# Patient Record
Sex: Female | Born: 2000 | Race: White | Hispanic: No | Marital: Single | Smoking: Never smoker
Health system: Southern US, Community
[De-identification: ages and names within clinical notes are randomized; demographics above are authoritative.]

## PROBLEM LIST (undated history)

## (undated) HISTORY — PX: APPENDECTOMY: SHX54

## (undated) HISTORY — PX: WISDOM TOOTH EXTRACTION: SHX21

---

## 2019-07-14 ENCOUNTER — Emergency Department (HOSPITAL_COMMUNITY): Payer: Self-pay

## 2019-07-14 ENCOUNTER — Emergency Department (HOSPITAL_COMMUNITY)
Admission: EM | Admit: 2019-07-14 | Discharge: 2019-07-14 | Disposition: A | Discharge status: Home / Self Care | Payer: Self-pay | Attending: Emergency Medicine | Admitting: Emergency Medicine

## 2019-07-14 ENCOUNTER — Encounter (HOSPITAL_COMMUNITY): Payer: Self-pay | Admitting: Emergency Medicine

## 2019-07-14 ENCOUNTER — Other Ambulatory Visit: Payer: Self-pay

## 2019-07-14 DIAGNOSIS — R1012 Left upper quadrant pain: Secondary | ICD-10-CM | POA: Insufficient documentation

## 2019-07-14 DIAGNOSIS — R112 Nausea with vomiting, unspecified: Secondary | ICD-10-CM | POA: Insufficient documentation

## 2019-07-14 LAB — URINALYSIS, ROUTINE W REFLEX MICROSCOPIC
Bacteria, UA: NONE SEEN
Bilirubin Urine: NEGATIVE
Glucose, UA: NEGATIVE mg/dL
Ketones, ur: NEGATIVE mg/dL
Leukocytes,Ua: NEGATIVE
Nitrite: NEGATIVE
Protein, ur: NEGATIVE mg/dL
Specific Gravity, Urine: 1.02 (ref 1.005–1.030)
pH: 5 (ref 5.0–8.0)

## 2019-07-14 LAB — CBC WITH DIFFERENTIAL/PLATELET
Abs Immature Granulocytes: 0.02 10*3/uL (ref 0.00–0.07)
Basophils Absolute: 0 10*3/uL (ref 0.0–0.1)
Basophils Relative: 0 %
Eosinophils Absolute: 0.2 10*3/uL (ref 0.0–0.5)
Eosinophils Relative: 2 %
HCT: 43 % (ref 36.0–46.0)
Hemoglobin: 14.2 g/dL (ref 12.0–15.0)
Immature Granulocytes: 0 %
Lymphocytes Relative: 25 %
Lymphs Abs: 1.9 10*3/uL (ref 0.7–4.0)
MCH: 27.4 pg (ref 26.0–34.0)
MCHC: 33 g/dL (ref 30.0–36.0)
MCV: 83 fL (ref 80.0–100.0)
Monocytes Absolute: 0.5 10*3/uL (ref 0.1–1.0)
Monocytes Relative: 7 %
Neutro Abs: 4.8 10*3/uL (ref 1.7–7.7)
Neutrophils Relative %: 66 %
Platelets: 388 10*3/uL (ref 150–400)
RBC: 5.18 MIL/uL — ABNORMAL HIGH (ref 3.87–5.11)
RDW: 14.4 % (ref 11.5–15.5)
WBC: 7.4 10*3/uL (ref 4.0–10.5)
nRBC: 0 % (ref 0.0–0.2)

## 2019-07-14 LAB — COMPREHENSIVE METABOLIC PANEL
ALT: 21 U/L (ref 0–44)
AST: 21 U/L (ref 15–41)
Albumin: 4.5 g/dL (ref 3.5–5.0)
Alkaline Phosphatase: 57 U/L (ref 38–126)
Anion gap: 12 (ref 5–15)
BUN: 16 mg/dL (ref 6–20)
CO2: 22 mmol/L (ref 22–32)
Calcium: 9.3 mg/dL (ref 8.9–10.3)
Chloride: 102 mmol/L (ref 98–111)
Creatinine, Ser: 0.76 mg/dL (ref 0.44–1.00)
GFR calc Af Amer: >60 mL/min (ref >60)
GFR calc non Af Amer: >60 mL/min (ref >60)
Glucose, Bld: 99 mg/dL (ref 70–99)
Potassium: 4.3 mmol/L (ref 3.5–5.1)
Sodium: 136 mmol/L (ref 135–145)
Total Bilirubin: 0.7 mg/dL (ref 0.3–1.2)
Total Protein: 8.6 g/dL — ABNORMAL HIGH (ref 6.5–8.1)

## 2019-07-14 LAB — LIPASE, BLOOD: Lipase: 36 U/L (ref 11–51)

## 2019-07-14 LAB — PREGNANCY, URINE: Preg Test, Ur: NEGATIVE

## 2019-07-14 MED ORDER — PROMETHAZINE HCL 25 MG PO TABS: 25.0000 mg | ORAL_TABLET | Freq: Four times a day (QID) | ORAL | 0 refills | Status: AC | PRN

## 2019-07-14 MED ORDER — ONDANSETRON HCL 4 MG/2ML IJ SOLN
4.0000 mg | Freq: Once | INTRAMUSCULAR | Status: AC
  Administered 2019-07-14: 4 mg via INTRAVENOUS
  Filled 2019-07-14: qty 2

## 2019-07-14 MED ORDER — OMEPRAZOLE 20 MG PO CPDR: 20.0000 mg | DELAYED_RELEASE_CAPSULE | Freq: Every day | ORAL | 0 refills | Status: AC

## 2019-07-14 MED ORDER — ONDANSETRON 4 MG PO TBDP: 4.0000 mg | ORAL_TABLET | Freq: Three times a day (TID) | ORAL | 0 refills | Status: DC | PRN

## 2019-07-14 MED ORDER — SODIUM CHLORIDE 0.9 % IV BOLUS
1000.0000 mL | Freq: Once | INTRAVENOUS | Status: AC
  Administered 2019-07-14: 11:00:00 1000 mL via INTRAVENOUS

## 2019-07-14 NOTE — ED Provider Notes (Signed)
[facility] EMERGENCY DEPARTMENT Provider Note   CSN: [id number] Arrival date & time: 07/14/19  2836     History Chief Complaint  Patient presents with  . Abdominal Pain    Kimberly Mathews is a 19 y.o. female.  HPI Patient presents with abdominal pain.  Has had over the last 2 weeks to a month.  Nausea and vomiting and upper abdominal pain when she eats.  No real diarrhea.  Last vomited 2 days ago.  States she has lost some weight but is not later self.  Closed loosely.  Pain is in the upper abdomen.  May be worse after eating but she is not quite sure.  Has not had a menses in 2 months.  She is normally regular.  States she has had 12 - pregnancy test at home however.  States she did discharge have some mild vaginal bleeding like a normal menses yesterday however.  No fever.  Patient states she has had a cholecystectomy.  Pain is in the epigastric area to the left upper abdomen.    History reviewed. No pertinent past medical history.  There are no problems to display for this patient.   Past Surgical History:  Procedure Laterality Date  . APPENDECTOMY    . WISDOM TOOTH EXTRACTION       OB History    Gravida  0   Para  0   Term  0   Preterm  0   AB  0   Living  0     SAB  0   TAB  0   Ectopic  0   Multiple  0   Live Births  0           History reviewed. No pertinent family history.  Social History   Tobacco Use  . Smoking status: Never Smoker  . Smokeless tobacco: Never Used  Substance Use Topics  . Alcohol use: Never  . Drug use: Never    Home Medications Prior to Admission medications   Medication Sig Start Date End Date Taking? Authorizing Provider  omeprazole (PRILOSEC) 20 MG capsule Take 1 capsule (20 mg total) by mouth daily. 07/14/19   Davonna Belling, MD  promethazine (PHENERGAN) 25 MG tablet Take 1 tablet (25 mg total) by mouth every 6 (six) hours as needed for nausea. 07/14/19   Davonna Belling, MD    Allergies    Ivp dye  [iodinated diagnostic agents]  Review of Systems   Review of Systems  Constitutional: Positive for appetite change.  HENT: Negative for congestion.   Respiratory: Negative for shortness of breath.   Gastrointestinal: Positive for abdominal pain, nausea and vomiting.  Genitourinary: Positive for vaginal bleeding.  Musculoskeletal: Negative for back pain.  Skin: Negative for rash.  Neurological: Negative for weakness.  Psychiatric/Behavioral: Negative for confusion.    Physical Exam Updated Vital Signs BP 126/78 (BP Location: Left Arm)   Pulse 89   Temp 97.8 F (36.6 C) (Oral)   Resp 16   Ht 5\' 5"  (1.651 m)   Wt 97.5 kg   LMP 07/13/2019   SpO2 99%   BMI 35.78 kg/m   Physical Exam Vitals and nursing note reviewed.  HENT:     Head: Normocephalic.  Cardiovascular:     Rate and Rhythm: Normal rate.  Pulmonary:     Breath sounds: Normal breath sounds.  Abdominal:     Comments: Mild epigastric to left upper quadrant tenderness.  No rebound or guarding.  Skin:  General: Skin is warm.     Capillary Refill: Capillary refill takes less than 2 seconds.  Neurological:     Mental Status: She is alert and oriented to person, place, and time.     ED Results / Procedures / Treatments   Labs (all labs ordered are listed, but only abnormal results are displayed) Labs Reviewed  COMPREHENSIVE METABOLIC PANEL - Abnormal; Notable for the following components:      Result Value   Total Protein 8.6 (*)    All other components within normal limits  CBC WITH DIFFERENTIAL/PLATELET - Abnormal; Notable for the following components:   RBC 5.18 (*)    All other components within normal limits  URINALYSIS, ROUTINE W REFLEX MICROSCOPIC - Abnormal; Notable for the following components:   Hgb urine dipstick LARGE (*)    All other components within normal limits  LIPASE, BLOOD  PREGNANCY, URINE    EKG None  Radiology US Abdomen Limited  Result Date: 07/14/2019 CLINICAL DATA:  Right  upper quadrant pain EXAM: ULTRASOUND ABDOMEN LIMITED RIGHT UPPER QUADRANT COMPARISON:  None. FINDINGS: Gallbladder: No gallstones or wall thickening visualized. No sonographic Murphy sign noted by sonographer. Common bile duct: Diameter: 2.5 mm Liver: No focal lesion identified. Increased hepatic parenchymal echogenicity. Portal vein is patent on color Doppler imaging with normal direction of blood flow towards the liver. Other: None. IMPRESSION: No cholelithiasis or sonographic evidence of acute cholecystitis. Increased hepatic echogenicity as can be seen with hepatic steatosis. Electronically Signed   By: Elige Ko   On: 07/14/2019 14:53    Procedures Procedures (including critical care time)  Medications Ordered in ED Medications  ondansetron (ZOFRAN) injection 4 mg (4 mg Intravenous Given 07/14/19 1039)  sodium chloride 0.9 % bolus 1,000 mL (0 mLs Intravenous Stopped 07/14/19 1300)    ED Course  I have reviewed the triage vital signs and the nursing notes.  Pertinent labs & imaging results that were available during my care of the patient were reviewed by me and considered in my medical decision making (see chart for details).    MDM Rules/Calculators/A&P                      Patient is upper abdominal pain.  Epigastric left upper quadrant tenderness.  Lab work reassuring.  Not pregnant.  Ultrasound done due to tenderness.  Does not show gallstones or cholecystitis.  Will treat symptomatically with Prilosec and initially Zofran been written for, however apparently got a little red after getting Zofran here.  Will give Phenergan instead.  Discharge home. Final Clinical Impression(s) / ED Diagnoses Final diagnoses:  Left upper quadrant abdominal pain    Rx / DC Orders ED Discharge Orders         Ordered    ondansetron (ZOFRAN-ODT) 4 MG disintegrating tablet  Every 8 hours PRN,   Status:  Discontinued     07/14/19 1501    omeprazole (PRILOSEC) 20 MG capsule  Daily     07/14/19 1501     promethazine (PHENERGAN) 25 MG tablet  Every 6 hours PRN     07/14/19 1504           Benjiman Core, MD 07/14/19 [phone]

## 2019-07-14 NOTE — ED Triage Notes (Addendum)
PT c/o upper abdominal discomfort with nausea and vomiting at times x1 month. PT states she thought she was possibly pregnant because she was 2 months late on her menstrual cycle but she started having vaginal bleeding yesterday and explains it like her normal cycle. PT states she last vomited x2 days ago. PT states last bowel movement was last night.

## 2019-07-14 NOTE — ED Notes (Signed)
Pt is aware we need urine sample.  

## 2019-07-14 NOTE — Discharge Instructions (Addendum)
Fill the pravastatin and Phenergan to take as needed.  Do not fill the Zofran prescription that may be at the pharmacy.  Follow-up with gastroenterology as needed

## 2019-07-14 NOTE — ED Notes (Addendum)
Patient states she believes she is allergic to Zofran. Pt appeared flushed when entering the room. After speaking with patient, she is back to baseline color.

## 2019-07-14 NOTE — ED Notes (Signed)
Patient states that she does not want lab in her room. States she is claustrophobic and only wants one nurse and one doctor in her room. Stated "I became claustrophobic when I was in my appendix surgery. I woke up during the surgery and passed out so they had to stop the surgery." Pt states she lives with her girlfriend and believes she is pregnant. Has taken 12 pregnancy tests. States she has "invitro at home" with a female partner. States she is on her period at this time.

## 2021-09-17 IMAGING — US US ABDOMEN LIMITED
1 series · 14 of 25 positions shown · non-contrast
Comparison: None.

CLINICAL DATA: Right upper quadrant pain

EXAM:
ULTRASOUND ABDOMEN LIMITED RIGHT UPPER QUADRANT

[Series 1: us abdomen limited · 0.19mm/px · 14 of 45 slices shown]
[im 1/45]
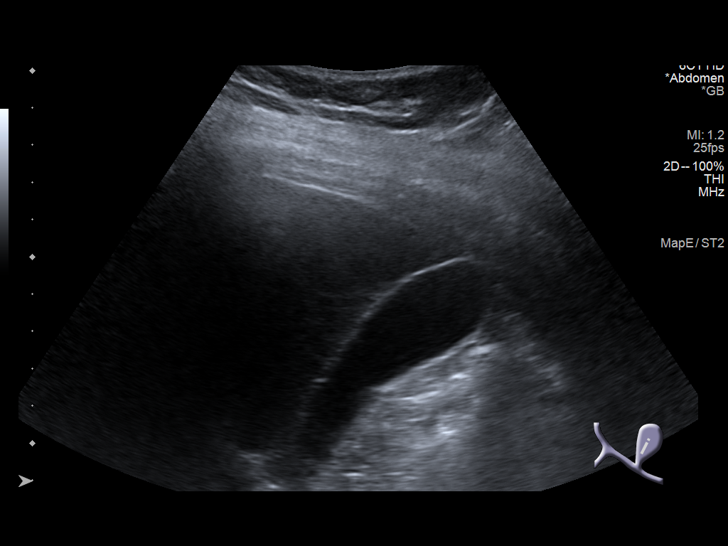
[im 4/45]
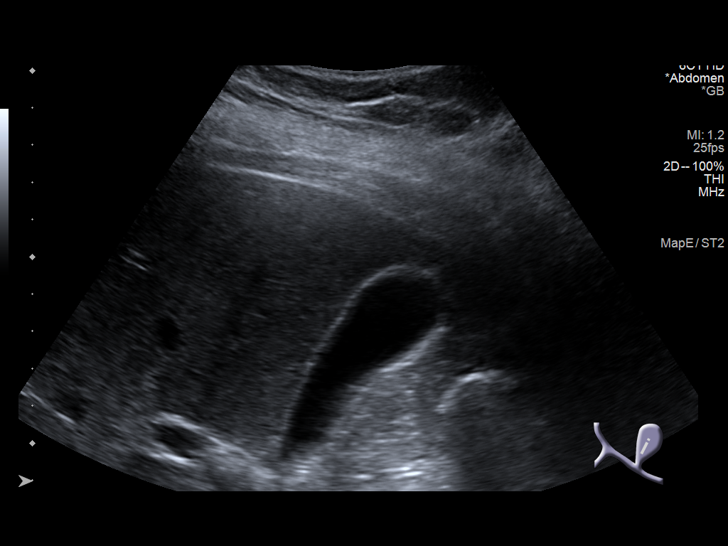
[im 8/45]
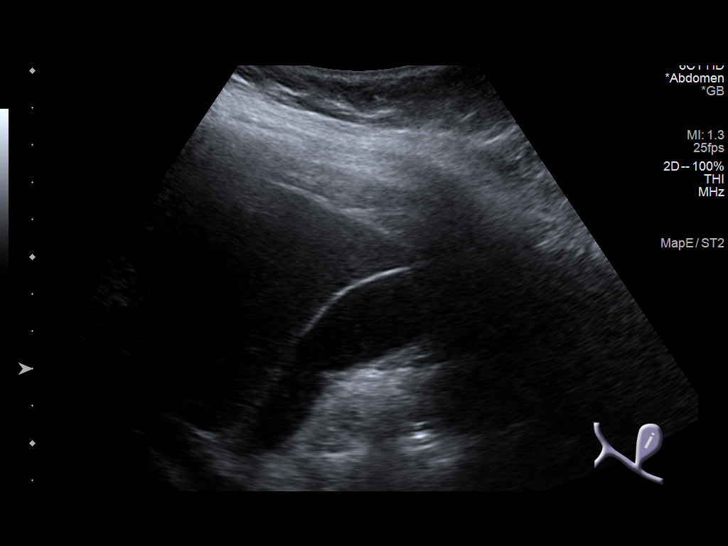
[im 12/45]
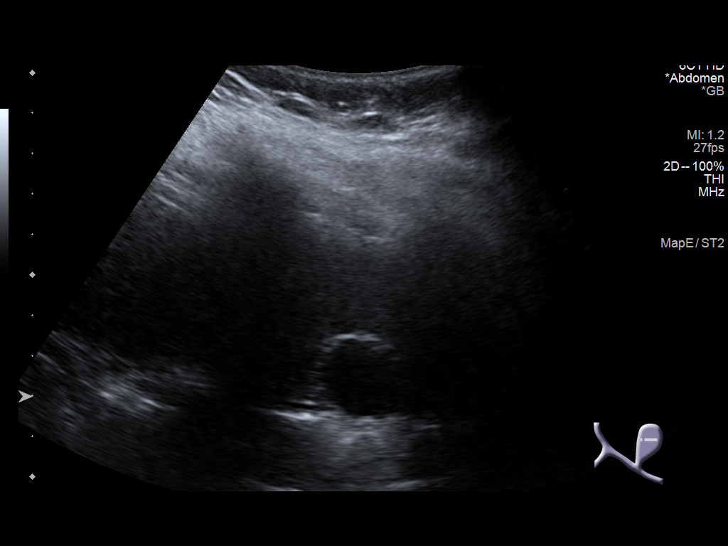
[im 15/45]
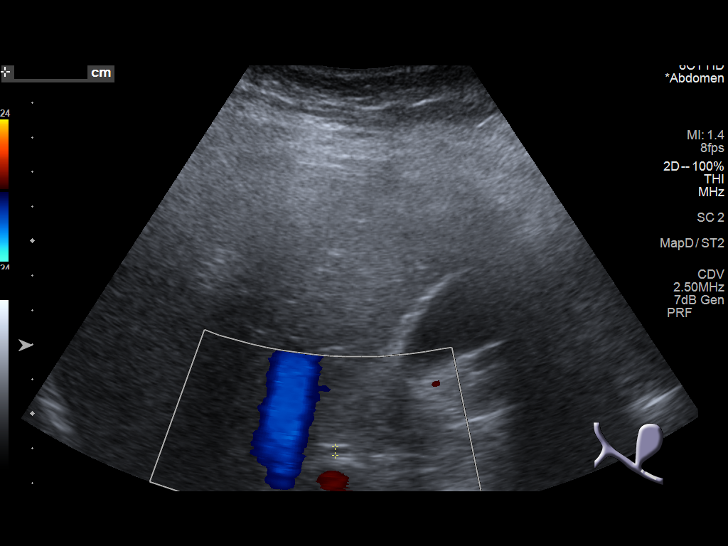
[im 17/45]
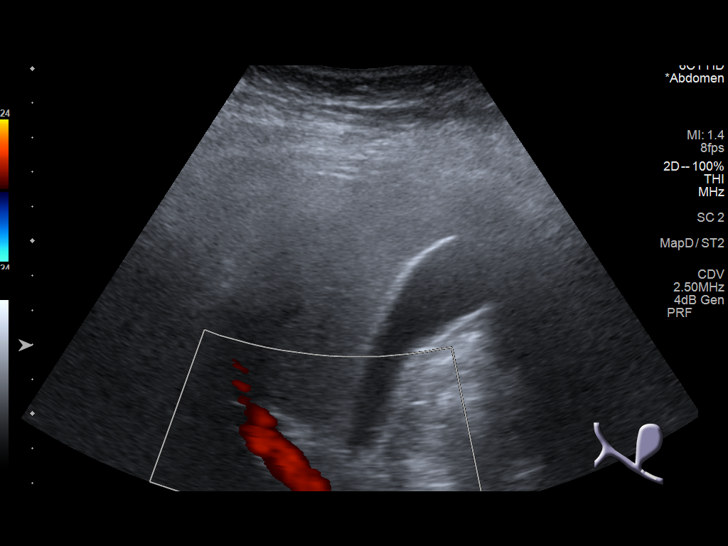
[im 21/45]
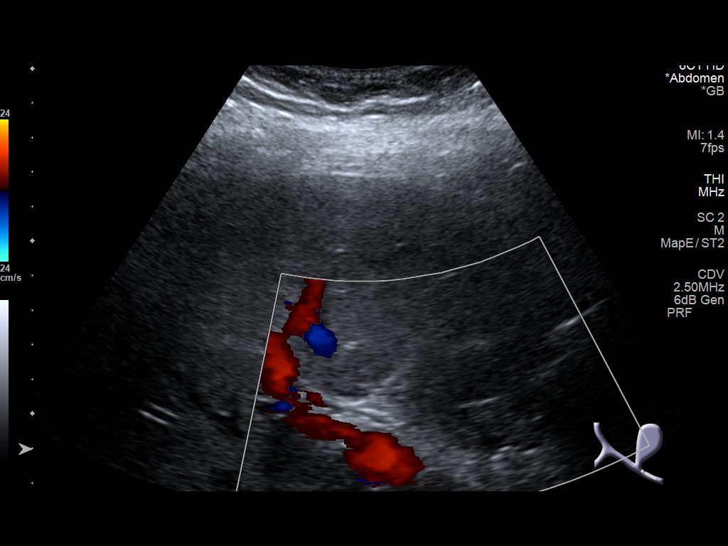
[im 24/45]
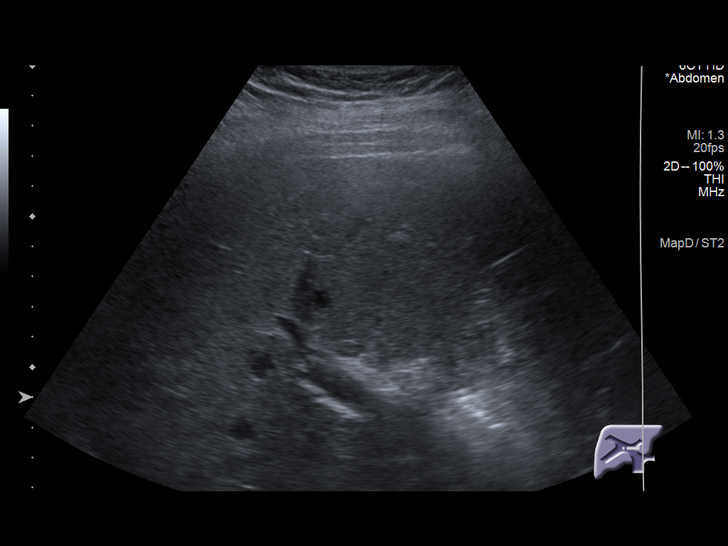
[im 28/45]
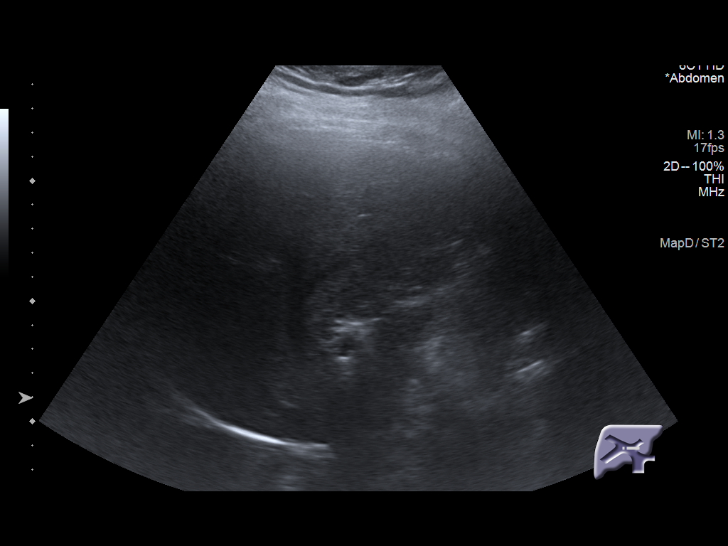
[im 30/45]
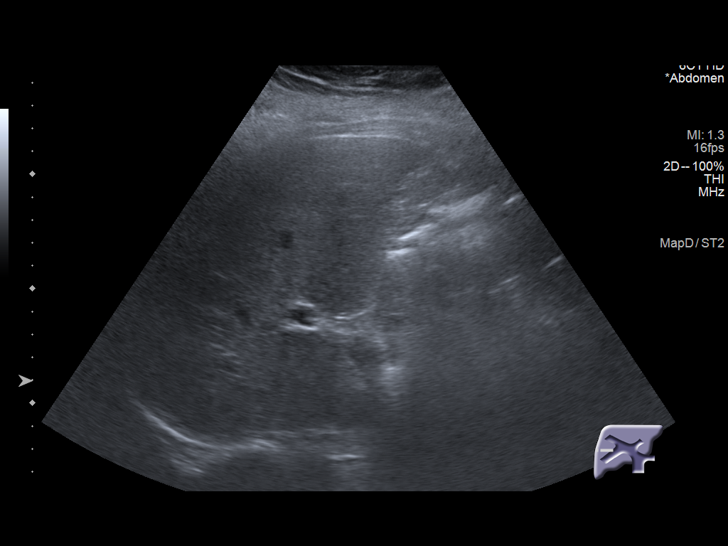
[im 34/45]
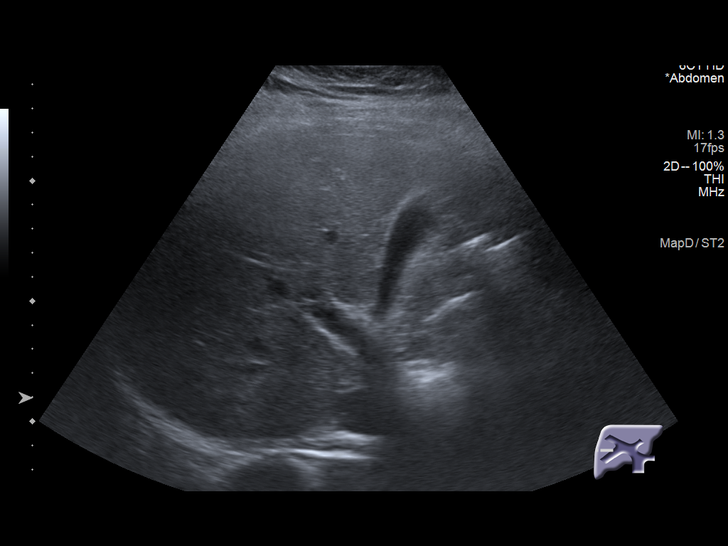
[im 37/45]
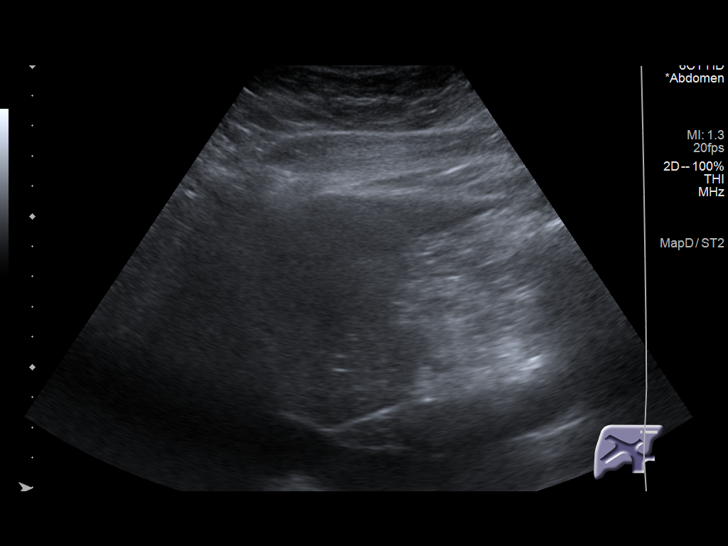
[im 41/45]
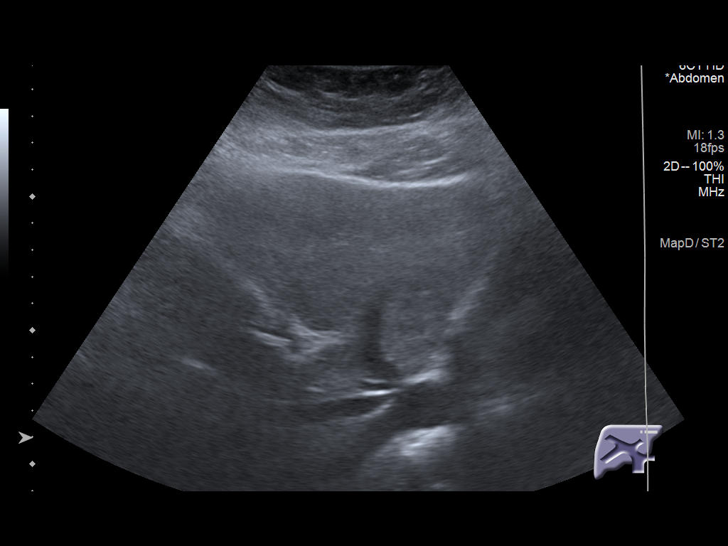
[im 45/45]
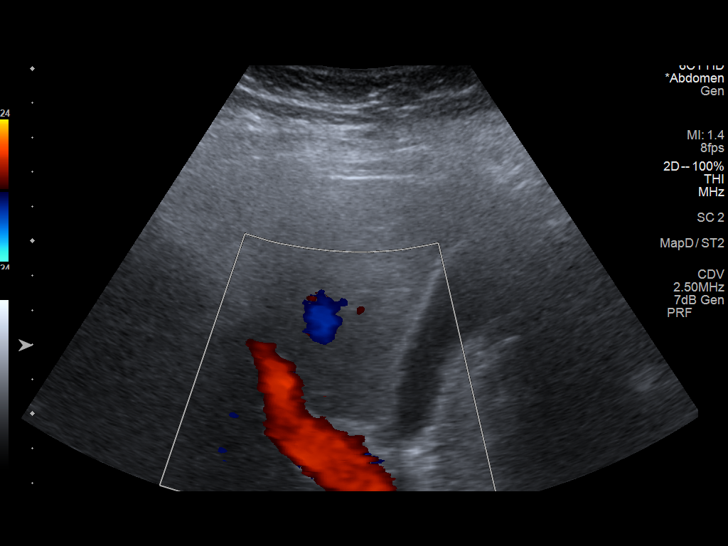

[14 of 25 positions shown; findings below may reference images not displayed]

FINDINGS: Gallbladder:

No gallstones or wall thickening visualized. No sonographic Murphy
sign noted by sonographer.

Common bile duct:

Diameter: 2.5 mm

Liver:

No focal lesion identified. Increased hepatic parenchymal
echogenicity. Portal vein is patent on color Doppler imaging with
normal direction of blood flow towards the liver.

Other: None.
IMPRESSION: No cholelithiasis or sonographic evidence of acute cholecystitis.

Increased hepatic echogenicity as can be seen with hepatic
steatosis.
